# Patient Record
Sex: Female | Born: 1935 | Race: White | Hispanic: No
Health system: Southern US, Community
[De-identification: ages and names within clinical notes are randomized; demographics above are authoritative.]

## PROBLEM LIST (undated history)

## (undated) DIAGNOSIS — C7800 Secondary malignant neoplasm of unspecified lung: Secondary | ICD-10-CM

## (undated) DIAGNOSIS — J189 Pneumonia, unspecified organism: Secondary | ICD-10-CM

## (undated) DIAGNOSIS — C159 Malignant neoplasm of esophagus, unspecified: Secondary | ICD-10-CM

## (undated) DIAGNOSIS — J9621 Acute and chronic respiratory failure with hypoxia: Secondary | ICD-10-CM

## (undated) DIAGNOSIS — I2699 Other pulmonary embolism without acute cor pulmonale: Secondary | ICD-10-CM

---

## 2018-05-17 ENCOUNTER — Other Ambulatory Visit (HOSPITAL_COMMUNITY): Payer: Medicare Other

## 2018-05-17 ENCOUNTER — Inpatient Hospital Stay
Admission: AD | Admit: 2018-05-17 | Discharge: 2018-05-29 | Disposition: A | Discharge status: Skilled Nursing Facility | Payer: Medicare Other | Source: Other Acute Inpatient Hospital | Attending: Internal Medicine | Admitting: Internal Medicine

## 2018-05-17 DIAGNOSIS — J189 Pneumonia, unspecified organism: Secondary | ICD-10-CM | POA: Diagnosis present

## 2018-05-17 DIAGNOSIS — I2699 Other pulmonary embolism without acute cor pulmonale: Secondary | ICD-10-CM | POA: Diagnosis present

## 2018-05-17 DIAGNOSIS — J9621 Acute and chronic respiratory failure with hypoxia: Secondary | ICD-10-CM | POA: Diagnosis present

## 2018-05-17 DIAGNOSIS — C7800 Secondary malignant neoplasm of unspecified lung: Secondary | ICD-10-CM | POA: Diagnosis present

## 2018-05-17 DIAGNOSIS — Z431 Encounter for attention to gastrostomy: Secondary | ICD-10-CM

## 2018-05-17 DIAGNOSIS — C159 Malignant neoplasm of esophagus, unspecified: Secondary | ICD-10-CM | POA: Diagnosis present

## 2018-05-17 DIAGNOSIS — T17908A Unspecified foreign body in respiratory tract, part unspecified causing other injury, initial encounter: Secondary | ICD-10-CM

## 2018-05-17 DIAGNOSIS — K567 Ileus, unspecified: Secondary | ICD-10-CM

## 2018-05-17 HISTORY — DX: Other pulmonary embolism without acute cor pulmonale: I26.99

## 2018-05-17 HISTORY — DX: Acute and chronic respiratory failure with hypoxia: J96.21

## 2018-05-17 HISTORY — DX: Malignant neoplasm of esophagus, unspecified: C15.9

## 2018-05-17 HISTORY — DX: Pneumonia, unspecified organism: J18.9

## 2018-05-17 HISTORY — DX: Secondary malignant neoplasm of unspecified lung: C78.00

## 2018-05-17 LAB — C DIFFICILE QUICK SCREEN W PCR REFLEX
C Diff antigen: NEGATIVE
C Diff interpretation: NOT DETECTED
C Diff toxin: NEGATIVE

## 2018-05-17 MED ORDER — IOPAMIDOL (ISOVUE-300) INJECTION 61%
INTRAVENOUS | Status: AC
  Administered 2018-05-17: 50 mL via GASTROSTOMY
  Filled 2018-05-17: qty 50

## 2018-05-18 LAB — CBC WITH DIFFERENTIAL/PLATELET
Abs Immature Granulocytes: 0.13 10*3/uL — ABNORMAL HIGH (ref 0.00–0.07)
Basophils Absolute: 0 10*3/uL (ref 0.0–0.1)
Basophils Relative: 0 %
Eosinophils Absolute: 0.1 10*3/uL (ref 0.0–0.5)
Eosinophils Relative: 1 %
HEMATOCRIT: 27 % — AB (ref 36.0–46.0)
HEMOGLOBIN: 8.9 g/dL — AB (ref 12.0–15.0)
Immature Granulocytes: 2 %
Lymphocytes Relative: 3 %
Lymphs Abs: 0.2 10*3/uL — ABNORMAL LOW (ref 0.7–4.0)
MCH: 29.8 pg (ref 26.0–34.0)
MCHC: 33 g/dL (ref 30.0–36.0)
MCV: 90.3 fL (ref 80.0–100.0)
Monocytes Absolute: 0.6 10*3/uL (ref 0.1–1.0)
Monocytes Relative: 8 %
NRBC: 0 % (ref 0.0–0.2)
Neutro Abs: 6.3 10*3/uL (ref 1.7–7.7)
Neutrophils Relative %: 86 %
Platelets: 203 10*3/uL (ref 150–400)
RBC: 2.99 MIL/uL — ABNORMAL LOW (ref 3.87–5.11)
RDW: 15.1 % (ref 11.5–15.5)
WBC: 7.3 10*3/uL (ref 4.0–10.5)

## 2018-05-18 LAB — COMPREHENSIVE METABOLIC PANEL
ALBUMIN: 2 g/dL — AB (ref 3.5–5.0)
ALT: 34 U/L (ref 0–44)
AST: 24 U/L (ref 15–41)
Alkaline Phosphatase: 149 U/L — ABNORMAL HIGH (ref 38–126)
Anion gap: 9 (ref 5–15)
BUN: 13 mg/dL (ref 8–23)
CO2: 26 mmol/L (ref 22–32)
CREATININE: 0.57 mg/dL (ref 0.44–1.00)
Calcium: 8.1 mg/dL — ABNORMAL LOW (ref 8.9–10.3)
Chloride: 95 mmol/L — ABNORMAL LOW (ref 98–111)
GFR calc Af Amer: >60 mL/min (ref >60)
GFR calc non Af Amer: >60 mL/min (ref >60)
GLUCOSE: 143 mg/dL — AB (ref 70–99)
Potassium: 3.1 mmol/L — ABNORMAL LOW (ref 3.5–5.1)
Sodium: 130 mmol/L — ABNORMAL LOW (ref 135–145)
Total Bilirubin: 0.5 mg/dL (ref 0.3–1.2)
Total Protein: 4.9 g/dL — ABNORMAL LOW (ref 6.5–8.1)

## 2018-05-18 LAB — URINALYSIS, ROUTINE W REFLEX MICROSCOPIC
Bacteria, UA: NONE SEEN
Bilirubin Urine: NEGATIVE
Glucose, UA: NEGATIVE mg/dL
Ketones, ur: NEGATIVE mg/dL
LEUKOCYTES UA: NEGATIVE
Nitrite: NEGATIVE
Protein, ur: NEGATIVE mg/dL
Specific Gravity, Urine: 1.019 (ref 1.005–1.030)
pH: 6 (ref 5.0–8.0)

## 2018-05-18 LAB — PHOSPHORUS: Phosphorus: 3.1 mg/dL (ref 2.5–4.6)

## 2018-05-18 LAB — TSH: TSH: 38.548 u[IU]/mL — ABNORMAL HIGH (ref 0.350–4.500)

## 2018-05-18 LAB — PROTIME-INR
INR: 1.26
Prothrombin Time: 15.7 seconds — ABNORMAL HIGH (ref 11.4–15.2)

## 2018-05-18 LAB — HEMOGLOBIN A1C
Hgb A1c MFr Bld: 5.8 % — ABNORMAL HIGH (ref 4.8–5.6)
Mean Plasma Glucose: 119.76 mg/dL

## 2018-05-18 LAB — MAGNESIUM: Magnesium: 1.6 mg/dL — ABNORMAL LOW (ref 1.7–2.4)

## 2018-05-19 ENCOUNTER — Encounter: Payer: Self-pay | Admitting: Internal Medicine

## 2018-05-19 ENCOUNTER — Other Ambulatory Visit (HOSPITAL_COMMUNITY): Payer: Medicare Other

## 2018-05-19 DIAGNOSIS — C7800 Secondary malignant neoplasm of unspecified lung: Secondary | ICD-10-CM | POA: Diagnosis present

## 2018-05-19 DIAGNOSIS — C7802 Secondary malignant neoplasm of left lung: Secondary | ICD-10-CM | POA: Diagnosis not present

## 2018-05-19 DIAGNOSIS — I2699 Other pulmonary embolism without acute cor pulmonale: Secondary | ICD-10-CM | POA: Diagnosis present

## 2018-05-19 DIAGNOSIS — J9621 Acute and chronic respiratory failure with hypoxia: Secondary | ICD-10-CM | POA: Diagnosis present

## 2018-05-19 DIAGNOSIS — C159 Malignant neoplasm of esophagus, unspecified: Secondary | ICD-10-CM

## 2018-05-19 DIAGNOSIS — J189 Pneumonia, unspecified organism: Secondary | ICD-10-CM | POA: Diagnosis not present

## 2018-05-19 LAB — BASIC METABOLIC PANEL
Anion gap: 7 (ref 5–15)
BUN: 15 mg/dL (ref 8–23)
CO2: 30 mmol/L (ref 22–32)
Calcium: 8.1 mg/dL — ABNORMAL LOW (ref 8.9–10.3)
Chloride: 91 mmol/L — ABNORMAL LOW (ref 98–111)
Creatinine, Ser: 0.6 mg/dL (ref 0.44–1.00)
GFR calc Af Amer: >60 mL/min (ref >60)
Glucose, Bld: 167 mg/dL — ABNORMAL HIGH (ref 70–99)
Potassium: 3.9 mmol/L (ref 3.5–5.1)
Sodium: 128 mmol/L — ABNORMAL LOW (ref 135–145)

## 2018-05-19 LAB — PHOSPHORUS: Phosphorus: 3.5 mg/dL (ref 2.5–4.6)

## 2018-05-19 LAB — CBC
HCT: 29 % — ABNORMAL LOW (ref 36.0–46.0)
Hemoglobin: 9.5 g/dL — ABNORMAL LOW (ref 12.0–15.0)
MCH: 30 pg (ref 26.0–34.0)
MCHC: 32.8 g/dL (ref 30.0–36.0)
MCV: 91.5 fL (ref 80.0–100.0)
NRBC: 0 % (ref 0.0–0.2)
Platelets: 222 10*3/uL (ref 150–400)
RBC: 3.17 MIL/uL — ABNORMAL LOW (ref 3.87–5.11)
RDW: 15.1 % (ref 11.5–15.5)
WBC: 6.9 10*3/uL (ref 4.0–10.5)

## 2018-05-19 LAB — URINALYSIS, ROUTINE W REFLEX MICROSCOPIC
Bilirubin Urine: NEGATIVE
Glucose, UA: NEGATIVE mg/dL
Ketones, ur: NEGATIVE mg/dL
Nitrite: NEGATIVE
Protein, ur: NEGATIVE mg/dL
Specific Gravity, Urine: 1.015 (ref 1.005–1.030)
pH: 6 (ref 5.0–8.0)

## 2018-05-19 LAB — MAGNESIUM: Magnesium: 2.1 mg/dL (ref 1.7–2.4)

## 2018-05-19 LAB — T4, FREE: Free T4: 0.78 ng/dL — ABNORMAL LOW (ref 0.82–1.77)

## 2018-05-19 MED ORDER — ACETAMINOPHEN 650 MG/20.3ML PO SOLN
650.00 | ORAL | Status: DC
Start: ? — End: 2018-05-19

## 2018-05-19 MED ORDER — GENERIC EXTERNAL MEDICATION
5.00 | Status: DC
Start: ? — End: 2018-05-19

## 2018-05-19 MED ORDER — GENERIC EXTERNAL MEDICATION
12.00 | Status: DC
Start: ? — End: 2018-05-19

## 2018-05-19 MED ORDER — ONDANSETRON HCL 4 MG/2ML IJ SOLN
4.00 | INTRAMUSCULAR | Status: DC
Start: ? — End: 2018-05-19

## 2018-05-19 MED ORDER — GLUCAGON HCL (DIAGNOSTIC) 1 MG IJ SOLR
1.00 | INTRAMUSCULAR | Status: DC
Start: ? — End: 2018-05-19

## 2018-05-19 MED ORDER — GLUCOSE 40 % PO GEL
ORAL | Status: DC
Start: ? — End: 2018-05-19

## 2018-05-19 MED ORDER — MORPHINE SULFATE 2 MG/ML IJ SOLN
1.00 | INTRAMUSCULAR | Status: DC
Start: ? — End: 2018-05-19

## 2018-05-19 MED ORDER — INSULIN REGULAR HUMAN 100 UNIT/ML IJ SOLN
0.00 | INTRAMUSCULAR | Status: DC
Start: 2018-05-17 — End: 2018-05-19

## 2018-05-19 MED ORDER — CLONAZEPAM 0.5 MG PO TABS
0.50 | ORAL_TABLET | ORAL | Status: DC
Start: 2018-05-17 — End: 2018-05-19

## 2018-05-19 MED ORDER — APIXABAN 5 MG PO TABS
5.00 | ORAL_TABLET | ORAL | Status: DC
Start: 2018-05-17 — End: 2018-05-19

## 2018-05-19 MED ORDER — PANTOPRAZOLE 40 MG/20 ML SUSPENSION
40.00 | PACK | ORAL | Status: DC
Start: 2018-05-18 — End: 2018-05-19

## 2018-05-19 MED ORDER — LORAZEPAM 2 MG/ML IJ SOLN
0.50 | INTRAMUSCULAR | Status: DC
Start: ? — End: 2018-05-19

## 2018-05-19 MED ORDER — PROSOURCE NO CARB PO LIQD
30.00 | ORAL | Status: DC
Start: 2018-05-18 — End: 2018-05-19

## 2018-05-19 MED ORDER — DEXTROSE 50 % IV SOLN
50.00 | INTRAVENOUS | Status: DC
Start: ? — End: 2018-05-19

## 2018-05-19 MED ORDER — METRONIDAZOLE 0.75 % EX GEL
1.00 | CUTANEOUS | Status: DC
Start: ? — End: 2018-05-19

## 2018-05-19 MED ORDER — DILTIAZEM HCL 30 MG PO TABS
30.00 | ORAL_TABLET | ORAL | Status: DC
Start: 2018-05-17 — End: 2018-05-19

## 2018-05-19 MED ORDER — MELATONIN 3 MG PO TABS
6.00 | ORAL_TABLET | ORAL | Status: DC
Start: ? — End: 2018-05-19

## 2018-05-19 MED ORDER — POLYETHYLENE GLYCOL 3350 17 G PO PACK
17.00 | PACK | ORAL | Status: DC
Start: ? — End: 2018-05-19

## 2018-05-19 MED ORDER — LEVOTHYROXINE SODIUM 50 MCG PO TABS
50.00 | ORAL_TABLET | ORAL | Status: DC
Start: ? — End: 2018-05-19

## 2018-05-19 MED ORDER — HYDRALAZINE HCL 20 MG/ML IJ SOLN
10.00 | INTRAMUSCULAR | Status: DC
Start: ? — End: 2018-05-19

## 2018-05-19 MED ORDER — IPRATROPIUM-ALBUTEROL 0.5-2.5 (3) MG/3ML IN SOLN
3.00 | RESPIRATORY_TRACT | Status: DC
Start: 2018-05-17 — End: 2018-05-19

## 2018-05-19 NOTE — Consult Note (Signed)
Pulmonary [place]  Date of Service: 05/19/2018  PULMONARY CRITICAL CARE CONSULT   Carol White  OIZ:[id number]  DOB: 11/03/1933   DOA: 05/17/2018  Referring Physician: Merton Border, MD  HPI: Carol White is a 85 y.o. female seen for follow up of Acute on Chronic Respiratory Failure.  Patient was referred to [place] for further evaluation and management.  She has history of multiple malignancies including malignant neoplasm of the esophagus as well as of the lungs.  Patient was diagnosed approximately 19 months ago with a history of a cervical esophageal squamous cell carcinoma.  Patient is failing followed by oncology she underwent a feeding tube placement as well as radiation treatments.  Patient also was initiated with the chemotherapy several months prior to admission.  There was some improvement in the symptoms.  Issue however in August became that there was some mild pulmonary metastatic lesions noted in the left lower lobe.  She was treated with radiation therapy for this she presented back to the hospital with increasing shortness of breath.  Repeat CT scan of the neck showed mild thickening of the aryepiglottic folds which were similar to the prior studies and also had a patent subglottic airway.  Patient was noted to have stridor on expiration but was not in any distress.  This was all at the time of admission.  In addition patient was noted to have an acute pulmonary embolism and probable worsening of the neoplasm in the lungs.  She was started on anticoagulation for the pulmonary embolism with Eliquis.  Because of the ongoing stridor ENT was evaluated there was concern for aspiration pneumonia and mucous plugging in the left lower lobe.  This however later was felt to be due to an obstructive pneumonia from progression of metastatic disease.  Patient underwent intubation and radiation to the left lower lobe.  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  Tracheostomy at that time was discussed and tracheostomy was done.  She is now transferred to our facility for further management.  Review of Systems:  ROS performed and is unremarkable other than noted above.  Past Medical History:  Diagnosis Date  . Acne rosacea  . Cancer (CMS/HCC) ([place])  esophageal and mets to the lungs  . Difficulty swallowing 2018  . Hypertension   Past medical history has been independently reviewed by me.  Past Surgical History:   Past Surgical History:  Procedure Laterality Date  . APPENDECTOMY  . BLADDER REPAIR  . GASTROSTOMY TUBE PLACEMENT N/A 12/29/2016  Procedure: OPEN GASTROSTOMY TUBE PLACEMENT ; Surgeon: Jefm Miles, MD; Location: [place]; Service:  . HYSTERECTOMY   Past surgical history has been independently reviewed by me.  Allergies  Allergen Reactions  . Codeine GI intolerance  Causes Nausea   Allergies are independently reviewed by me.  Social History:   Social History   Tobacco Use  . Smoking status: Never Smoker  . Smokeless tobacco: Never Used  Substance Use Topics  . Alcohol use: No  . Drug use: No      Medications: Reviewed on Rounds  Physical Exam:  Vitals: Temperature is 98.2 pulse 106 respiratory 14 blood pressure 140/64 saturations 98%  Ventilator Settings currently is off the ventilator on T collar FiO2 is 28%  . General: Comfortable at this time . Eyes: Grossly normal lids, irises & conjunctiva .  ENT: grossly tongue is normal . Neck: no obvious mass . Cardiovascular: S1-S2 normal no gallop or rub . Respiratory: No rhonchi or rales are noted at this time . Abdomen: Soft and nontender . Skin: no rash seen on limited exam . Musculoskeletal: not rigid . Psychiatric:unable to assess . Neurologic: no seizure no involuntary movements         Labs on Admission:  Basic  Metabolic Panel: Recent Labs  Lab 05/18/18 0449 05/19/18 0523  NA 130* 128*  K 3.1* 3.9  CL 95* 91*  CO2 26 30  GLUCOSE 143* 167*  BUN 13 15  CREATININE 0.57 0.60  CALCIUM 8.1* 8.1*  MG 1.6* 2.1  PHOS 3.1 3.5    No results for input(s): PHART, PCO2ART, PO2ART, HCO3, O2SAT in the last 168 hours.  Liver Function Tests: Recent Labs  Lab 05/18/18 0449  AST 24  ALT 34  ALKPHOS 149*  BILITOT 0.5  PROT 4.9*  ALBUMIN 2.0*   No results for input(s): LIPASE, AMYLASE in the last 168 hours. No results for input(s): AMMONIA in the last 168 hours.  CBC: Recent Labs  Lab 05/18/18 0449 05/19/18 0523  WBC 7.3 6.9  NEUTROABS 6.3  --   HGB 8.9* 9.5*  HCT 27.0* 29.0*  MCV 90.3 91.5  PLT 203 222    Cardiac Enzymes: No results for input(s): CKTOTAL, CKMB, CKMBINDEX, TROPONINI in the last 168 hours.  BNP (last 3 results) No results for input(s): BNP in the last 8760 hours.  ProBNP (last 3 results) No results for input(s): PROBNP in the last 8760 hours.   Radiological Exams on Admission: Dg Chest Port 1 View  Result Date: 05/17/2018 CLINICAL DATA:  Tracheostomy tube.  Peg tube. EXAM: PORTABLE CHEST 1 VIEW COMPARISON:  None. FINDINGS: The heart is enlarged. Left basilar airspace disease is present. A left pleural effusion is noted. Tracheostomy tube is in place. A right subclavian line terminates at the cavoatrial junction. Right base is clear. Contrast is present within the stomach. Degenerative changes are noted at the shoulders, left greater than right. IMPRESSION: 1. Left lower lobe airspace disease and effusion, indicating pneumonia. 2. Cardiomegaly. 3. Support apparatus in satisfactory position. Electronically Signed   By: San Morelle M.D.   On: 05/17/2018 18:30   Dg Abd Portable 1v  Result Date: 05/17/2018 CLINICAL DATA:  Peg tube placement. EXAM: PORTABLE ABDOMEN - 1 VIEW COMPARISON:  One-view chest x-ray of the same day FINDINGS: Contrast injected via the PEG  tube layers in the stomach. Bowel gas pattern is otherwise normal. There is no evidence for leak. Degenerative changes are present in the lumbar spine. IMPRESSION: Peg tube confirmed within the stomach. Electronically Signed   By: San Morelle M.D.   On: 05/17/2018 18:31    Assessment/Plan Active Problems:   Acute on chronic respiratory failure with hypoxia (HCC)   Acute pulmonary embolism without acute cor pulmonale (HCC)   Postobstructive pneumonia   Squamous cell esophageal cancer (HCC)   Metastatic cancer to lung ([place])   1. Acute on chronic respiratory failure with hypoxia patient will continue with T collar at this time.  Because of her history of vocal cord paralysis it is unlikely that she will be decannulated.  She will need follow-up with ENT after discharge. 2. Pulmonary embolism treated with anticoagulation this will be continued.  We will continue to monitor closely. 3. Postobstructive pneumonia secondary to worsening of her neoplasm.  She is received radiation therapy will continue to monitor closely prognosis  obviously is quite poor. 4. Primary malignant neoplasm of the esophagus patient has had treatment with chemoradiation. 5. Metastatic lung cancer patient has been also treated with radiation which overall prognosis as already noted is poor  I have personally seen and evaluated the patient, evaluated laboratory and imaging results, formulated the assessment and plan and placed orders. The Patient requires high complexity decision making for assessment and support.  Case was discussed on Rounds with the Respiratory Therapy Staff Time Spent 58minutes  Allyne Gee, MD [facility] Pulmonary Critical Care Medicine Sleep Medicine

## 2018-05-20 DIAGNOSIS — I2699 Other pulmonary embolism without acute cor pulmonale: Secondary | ICD-10-CM | POA: Diagnosis not present

## 2018-05-20 DIAGNOSIS — J189 Pneumonia, unspecified organism: Secondary | ICD-10-CM | POA: Diagnosis not present

## 2018-05-20 DIAGNOSIS — C7802 Secondary malignant neoplasm of left lung: Secondary | ICD-10-CM | POA: Diagnosis not present

## 2018-05-20 DIAGNOSIS — J9621 Acute and chronic respiratory failure with hypoxia: Secondary | ICD-10-CM | POA: Diagnosis not present

## 2018-05-20 LAB — BASIC METABOLIC PANEL
Anion gap: 9 (ref 5–15)
BUN: 26 mg/dL — ABNORMAL HIGH (ref 8–23)
CO2: 27 mmol/L (ref 22–32)
Calcium: 8.2 mg/dL — ABNORMAL LOW (ref 8.9–10.3)
Chloride: 92 mmol/L — ABNORMAL LOW (ref 98–111)
Creatinine, Ser: 0.81 mg/dL (ref 0.44–1.00)
GFR calc non Af Amer: >60 mL/min (ref >60)
Glucose, Bld: 120 mg/dL — ABNORMAL HIGH (ref 70–99)
Potassium: 4.3 mmol/L (ref 3.5–5.1)
Sodium: 128 mmol/L — ABNORMAL LOW (ref 135–145)

## 2018-05-20 LAB — PHOSPHORUS: Phosphorus: 4.7 mg/dL — ABNORMAL HIGH (ref 2.5–4.6)

## 2018-05-20 LAB — CBC
HCT: 26.3 % — ABNORMAL LOW (ref 36.0–46.0)
Hemoglobin: 8.6 g/dL — ABNORMAL LOW (ref 12.0–15.0)
MCH: 29.7 pg (ref 26.0–34.0)
MCHC: 32.7 g/dL (ref 30.0–36.0)
MCV: 90.7 fL (ref 80.0–100.0)
Platelets: 247 10*3/uL (ref 150–400)
RBC: 2.9 MIL/uL — ABNORMAL LOW (ref 3.87–5.11)
RDW: 15.3 % (ref 11.5–15.5)
WBC: 7.6 10*3/uL (ref 4.0–10.5)
nRBC: 0 % (ref 0.0–0.2)

## 2018-05-20 LAB — MAGNESIUM: Magnesium: 2.1 mg/dL (ref 1.7–2.4)

## 2018-05-20 NOTE — Progress Notes (Signed)
Pulmonary Critical Care Medicine [place]   PULMONARY CRITICAL CARE SERVICE  PROGRESS NOTE  Date of Service: 05/20/2018  Carol White  WCH:[id number]  DOB: 1934-03-18   DOA: 05/17/2018  Referring Physician: Merton Border, MD  HPI: Carol White is a 85 y.o. female seen for follow up of Acute on Chronic Respiratory Failure.  At this time patient is on T collar she is comfortable without distress she is awake.  Family was present in the room.  Spoke with the daughter as well as son over the phone explained to them that she has diagnosis stage IIIb cancer overall her prognosis is quite guarded.  In addition explained to them that we will not be taking the tracheostomy out anytime soon because of the suspicion of paralyzed vocal cords and the difficulty with her prior intubation and extubation.  Medications: Reviewed on Rounds  Physical Exam:  Vitals: Temperature 97.0 pulse 103 respiratory rate 18 blood pressure 110/66 saturations 97%  Ventilator Settings off the ventilator on T collar FiO2 28%  . General: Comfortable at this time . Eyes: Grossly normal lids, irises & conjunctiva . ENT: grossly tongue is normal . Neck: no obvious mass . Cardiovascular: S1 S2 normal no gallop . Respiratory: Scattered rhonchi expansion is equal . Abdomen: soft . Skin: no rash seen on limited exam . Musculoskeletal: not rigid . Psychiatric:unable to assess . Neurologic: no seizure no involuntary movements         Lab Data:   Basic Metabolic Panel: Recent Labs  Lab 05/18/18 0449 05/19/18 0523 05/20/18 0604  NA 130* 128* 128*  K 3.1* 3.9 4.3  CL 95* 91* 92*  CO2 26 30 27   GLUCOSE 143* 167* 120*  BUN 13 15 26*  CREATININE 0.57 0.60 0.81  CALCIUM 8.1* 8.1* 8.2*  MG 1.6* 2.1 2.1  PHOS 3.1 3.5 4.7*    ABG: No results for input(s): PHART, PCO2ART, PO2ART, HCO3, O2SAT in the last 168 hours.  Liver Function Tests: Recent Labs  Lab 05/18/18 0449  AST 24  ALT  34  ALKPHOS 149*  BILITOT 0.5  PROT 4.9*  ALBUMIN 2.0*   No results for input(s): LIPASE, AMYLASE in the last 168 hours. No results for input(s): AMMONIA in the last 168 hours.  CBC: Recent Labs  Lab 05/18/18 0449 05/19/18 0523 05/20/18 0604  WBC 7.3 6.9 7.6  NEUTROABS 6.3  --   --   HGB 8.9* 9.5* 8.6*  HCT 27.0* 29.0* 26.3*  MCV 90.3 91.5 90.7  PLT 203 222 247    Cardiac Enzymes: No results for input(s): CKTOTAL, CKMB, CKMBINDEX, TROPONINI in the last 168 hours.  BNP (last 3 results) No results for input(s): BNP in the last 8760 hours.  ProBNP (last 3 results) No results for input(s): PROBNP in the last 8760 hours.  Radiological Exams: Dg Chest Port 1 View  Result Date: 05/19/2018 CLINICAL DATA:  Follow-up LEFT LOWER LOBE pneumonia possibly related to aspiration. EXAM: PORTABLE CHEST 1 VIEW COMPARISON:  05/17/2018. FINDINGS: Tracheostomy tube tip in satisfactory position below the thoracic inlet. RIGHT jugular central venous catheter tip projects at or near the cavoatrial junction, unchanged. Dense consolidation in the LEFT LOWER LOBE and likely associated LEFT pleural effusion, unchanged. No new pulmonary parenchymal abnormalities. Cardiac silhouette mildly to moderately enlarged. Pulmonary vascularity normal. Note is made of gaseous distention of the visualized colon in the upper abdomen IMPRESSION: 1. Support apparatus satisfactory. 2. Stable dense pneumonia in the LEFT LOWER LOBE and likely  associated LEFT pleural effusion. 3. cardiomegaly without pulmonary edema. 4. Gaseous distention of the visualized colon in the upper abdomen, query ileus. Electronically Signed   By: Evangeline Dakin M.D.   On: 05/19/2018 19:57    Assessment/Plan Active Problems:   Acute on chronic respiratory failure with hypoxia (HCC)   Acute pulmonary embolism without acute cor pulmonale (HCC)   Postobstructive pneumonia   Squamous cell esophageal cancer (HCC)   Metastatic cancer to lung  ([place])   1. Acute on chronic respiratory failure with hypoxia we will continue with T collar continue secretion management pulmonary toilet.  Right now patient is at baseline 2. Acute pulmonary embolism treated continue with present management 3. Postobstructive pneumonia follow radiologically. 4. Squamous cell esophageal cancer prognosis poor 5. Metastatic cancer to the lung prognosis poor 60 years stage IIIb based on the notes   I have personally seen and evaluated the patient, evaluated laboratory and imaging results, formulated the assessment and plan and placed orders. The Patient requires high complexity decision making for assessment and support.  Case was discussed on Rounds with the Respiratory Therapy Staff  Allyne Gee, MD [facility] Pulmonary Critical Care Medicine Sleep Medicine

## 2018-05-21 DIAGNOSIS — C7802 Secondary malignant neoplasm of left lung: Secondary | ICD-10-CM | POA: Diagnosis not present

## 2018-05-21 DIAGNOSIS — I2699 Other pulmonary embolism without acute cor pulmonale: Secondary | ICD-10-CM | POA: Diagnosis not present

## 2018-05-21 DIAGNOSIS — J189 Pneumonia, unspecified organism: Secondary | ICD-10-CM | POA: Diagnosis not present

## 2018-05-21 DIAGNOSIS — J9621 Acute and chronic respiratory failure with hypoxia: Secondary | ICD-10-CM | POA: Diagnosis not present

## 2018-05-21 NOTE — Progress Notes (Signed)
Pulmonary Critical Care Medicine [place]   PULMONARY CRITICAL CARE SERVICE  PROGRESS NOTE  Date of Service: 05/21/2018  Carol White  [id number]  DOB: 09-Nov-1933   DOA: 05/17/2018  Referring Physician: Merton Border, MD  HPI: Carol White is a 85 y.o. female seen for follow up of Acute on Chronic Respiratory Failure.  Patient is doing about the same she is on T collar currently is on 28% FiO2.  As noted yesterday I do not have plans for decannulate in her secondary to vocal cord issues  Medications: Reviewed on Rounds  Physical Exam:  Vitals: Temperature 99.2 pulse 107 respiratory rate 25 blood pressure 147/55 saturations 99%  Ventilator Settings off the ventilator on T collar  . General: Comfortable at this time . Eyes: Grossly normal lids, irises & conjunctiva . ENT: grossly tongue is normal . Neck: no obvious mass . Cardiovascular: S1 S2 normal no gallop . Respiratory: No rhonchi no rales . Abdomen: soft . Skin: no rash seen on limited exam . Musculoskeletal: not rigid . Psychiatric:unable to assess . Neurologic: no seizure no involuntary movements         Lab Data:   Basic Metabolic Panel: Recent Labs  Lab 05/18/18 0449 05/19/18 0523 05/20/18 0604  NA 130* 128* 128*  K 3.1* 3.9 4.3  CL 95* 91* 92*  CO2 26 30 27   GLUCOSE 143* 167* 120*  BUN 13 15 26*  CREATININE 0.57 0.60 0.81  CALCIUM 8.1* 8.1* 8.2*  MG 1.6* 2.1 2.1  PHOS 3.1 3.5 4.7*    ABG: No results for input(s): PHART, PCO2ART, PO2ART, HCO3, O2SAT in the last 168 hours.  Liver Function Tests: Recent Labs  Lab 05/18/18 0449  AST 24  ALT 34  ALKPHOS 149*  BILITOT 0.5  PROT 4.9*  ALBUMIN 2.0*   No results for input(s): LIPASE, AMYLASE in the last 168 hours. No results for input(s): AMMONIA in the last 168 hours.  CBC: Recent Labs  Lab 05/18/18 0449 05/19/18 0523 05/20/18 0604  WBC 7.3 6.9 7.6  NEUTROABS 6.3  --   --   HGB 8.9* 9.5* 8.6*  HCT 27.0*  29.0* 26.3*  MCV 90.3 91.5 90.7  PLT 203 222 247    Cardiac Enzymes: No results for input(s): CKTOTAL, CKMB, CKMBINDEX, TROPONINI in the last 168 hours.  BNP (last 3 results) No results for input(s): BNP in the last 8760 hours.  ProBNP (last 3 results) No results for input(s): PROBNP in the last 8760 hours.  Radiological Exams: Dg Chest Port 1 View  Result Date: 05/19/2018 CLINICAL DATA:  Follow-up LEFT LOWER LOBE pneumonia possibly related to aspiration. EXAM: PORTABLE CHEST 1 VIEW COMPARISON:  05/17/2018. FINDINGS: Tracheostomy tube tip in satisfactory position below the thoracic inlet. RIGHT jugular central venous catheter tip projects at or near the cavoatrial junction, unchanged. Dense consolidation in the LEFT LOWER LOBE and likely associated LEFT pleural effusion, unchanged. No new pulmonary parenchymal abnormalities. Cardiac silhouette mildly to moderately enlarged. Pulmonary vascularity normal. Note is made of gaseous distention of the visualized colon in the upper abdomen IMPRESSION: 1. Support apparatus satisfactory. 2. Stable dense pneumonia in the LEFT LOWER LOBE and likely associated LEFT pleural effusion. 3. cardiomegaly without pulmonary edema. 4. Gaseous distention of the visualized colon in the upper abdomen, query ileus. Electronically Signed   By: Evangeline Dakin M.D.   On: 05/19/2018 19:57    Assessment/Plan Active Problems:   Acute on chronic respiratory failure with hypoxia (HCC)  Acute pulmonary embolism without acute cor pulmonale (HCC)   Postobstructive pneumonia   Squamous cell esophageal cancer (HCC)   Metastatic cancer to lung ([place])   1. Acute on chronic respiratory failure with hypoxia we will continue with T collar as tolerated continue secretion management pulmonary toilet 2. Acute pulmonary embolism treated we will continue supportive care 3. Postobstructive pneumonia secondary to malignancy we will continue supportive care 4. Metastatic lung cancer  we will continue with supportive care patient's family wants to discuss further with their oncologist 5. Squamous cell carcinoma of the esophagus overall prognosis is poor   I have personally seen and evaluated the patient, evaluated laboratory and imaging results, formulated the assessment and plan and placed orders. The Patient requires high complexity decision making for assessment and support.  Case was discussed on Rounds with the Respiratory Therapy Staff  Allyne Gee, MD [facility] Pulmonary Critical Care Medicine Sleep Medicine

## 2018-05-22 ENCOUNTER — Other Ambulatory Visit (HOSPITAL_COMMUNITY): Payer: Medicare Other

## 2018-05-22 DIAGNOSIS — C7802 Secondary malignant neoplasm of left lung: Secondary | ICD-10-CM | POA: Diagnosis not present

## 2018-05-22 DIAGNOSIS — J9621 Acute and chronic respiratory failure with hypoxia: Secondary | ICD-10-CM | POA: Diagnosis not present

## 2018-05-22 DIAGNOSIS — J189 Pneumonia, unspecified organism: Secondary | ICD-10-CM | POA: Diagnosis not present

## 2018-05-22 DIAGNOSIS — I2699 Other pulmonary embolism without acute cor pulmonale: Secondary | ICD-10-CM | POA: Diagnosis not present

## 2018-05-22 LAB — URINE CULTURE: Culture: >=100000 — AB

## 2018-05-22 LAB — BASIC METABOLIC PANEL
Anion gap: 10 (ref 5–15)
BUN: 18 mg/dL (ref 8–23)
CO2: 26 mmol/L (ref 22–32)
Calcium: 8.2 mg/dL — ABNORMAL LOW (ref 8.9–10.3)
Chloride: 96 mmol/L — ABNORMAL LOW (ref 98–111)
Creatinine, Ser: 0.76 mg/dL (ref 0.44–1.00)
GFR calc Af Amer: >60 mL/min (ref >60)
GFR calc non Af Amer: >60 mL/min (ref >60)
Glucose, Bld: 117 mg/dL — ABNORMAL HIGH (ref 70–99)
Potassium: 4.1 mmol/L (ref 3.5–5.1)
Sodium: 132 mmol/L — ABNORMAL LOW (ref 135–145)

## 2018-05-22 NOTE — Progress Notes (Signed)
Pulmonary Critical Care Medicine [place]   PULMONARY CRITICAL CARE SERVICE  PROGRESS NOTE  Date of Service: 05/22/2018  Carol White  IWP:[id number]  DOB: Apr 19, 1934   DOA: 05/17/2018  Referring Physician: Merton Border, MD  HPI: Carol White is a 85 y.o. female seen for follow up of Acute on Chronic Respiratory Failure.  Patient is currently on T collar without distress.  Family is thinking of home training and to take her home  Medications: Reviewed on Rounds  Physical Exam:  Vitals: Temperature 98.1 pulse 111 respiratory 16 blood pressure 129/72 saturations 98%  Ventilator Settings currently off the ventilator on T collar  . General: Comfortable at this time . Eyes: Grossly normal lids, irises & conjunctiva . ENT: grossly tongue is normal . Neck: no obvious mass . Cardiovascular: S1 S2 normal no gallop . Respiratory: No rhonchi or rales are noted at this time . Abdomen: soft . Skin: no rash seen on limited exam . Musculoskeletal: not rigid . Psychiatric:unable to assess . Neurologic: no seizure no involuntary movements         Lab Data:   Basic Metabolic Panel: Recent Labs  Lab 05/18/18 0449 05/19/18 0523 05/20/18 0604 05/22/18 0610  NA 130* 128* 128* 132*  K 3.1* 3.9 4.3 4.1  CL 95* 91* 92* 96*  CO2 26 30 27 26   GLUCOSE 143* 167* 120* 117*  BUN 13 15 26* 18  CREATININE 0.57 0.60 0.81 0.76  CALCIUM 8.1* 8.1* 8.2* 8.2*  MG 1.6* 2.1 2.1  --   PHOS 3.1 3.5 4.7*  --     ABG: No results for input(s): PHART, PCO2ART, PO2ART, HCO3, O2SAT in the last 168 hours.  Liver Function Tests: Recent Labs  Lab 05/18/18 0449  AST 24  ALT 34  ALKPHOS 149*  BILITOT 0.5  PROT 4.9*  ALBUMIN 2.0*   No results for input(s): LIPASE, AMYLASE in the last 168 hours. No results for input(s): AMMONIA in the last 168 hours.  CBC: Recent Labs  Lab 05/18/18 0449 05/19/18 0523 05/20/18 0604  WBC 7.3 6.9 7.6  NEUTROABS 6.3  --   --   HGB  8.9* 9.5* 8.6*  HCT 27.0* 29.0* 26.3*  MCV 90.3 91.5 90.7  PLT 203 222 247    Cardiac Enzymes: No results for input(s): CKTOTAL, CKMB, CKMBINDEX, TROPONINI in the last 168 hours.  BNP (last 3 results) No results for input(s): BNP in the last 8760 hours.  ProBNP (last 3 results) No results for input(s): PROBNP in the last 8760 hours.  Radiological Exams: No results found.  Assessment/Plan Active Problems:   Acute on chronic respiratory failure with hypoxia (HCC)   Acute pulmonary embolism without acute cor pulmonale (HCC)   Postobstructive pneumonia   Squamous cell esophageal cancer (HCC)   Metastatic cancer to lung ([place])   1. Acute on chronic respiratory failure with hypoxia we will continue with T collar as tolerated continue secretion management pulmonary toilet. 2. Acute pulmonary embolism continue with supportive care 3. Postobstructive pneumonia prognosis poor continue with aggressive pulmonary toilet 4. Squamous cell carcinoma esophagus advanced disease 5. Metastatic cancer to the lungs as noted previously advanced disease continue with supportive care   I have personally seen and evaluated the patient, evaluated laboratory and imaging results, formulated the assessment and plan and placed orders. The Patient requires high complexity decision making for assessment and support.  Case was discussed on Rounds with the Respiratory Therapy Staff  Allyne Gee, MD [facility]  Pulmonary Critical Care Medicine Sleep Medicine

## 2018-05-23 DIAGNOSIS — I2699 Other pulmonary embolism without acute cor pulmonale: Secondary | ICD-10-CM | POA: Diagnosis not present

## 2018-05-23 DIAGNOSIS — J189 Pneumonia, unspecified organism: Secondary | ICD-10-CM | POA: Diagnosis not present

## 2018-05-23 DIAGNOSIS — C7802 Secondary malignant neoplasm of left lung: Secondary | ICD-10-CM | POA: Diagnosis not present

## 2018-05-23 DIAGNOSIS — J9621 Acute and chronic respiratory failure with hypoxia: Secondary | ICD-10-CM | POA: Diagnosis not present

## 2018-05-23 NOTE — Progress Notes (Signed)
Pulmonary Critical Care Medicine [place]   PULMONARY CRITICAL CARE SERVICE  PROGRESS NOTE  Date of Service: 05/23/2018  ONDRIA OSWALD  WUJ:[id number]  DOB: [birth year]   DOA: 05/17/2018  Referring Physician: Merton Border, MD  HPI: Carol White is a 85 y.o. female seen for follow up of Acute on Chronic Respiratory Failure.  Spoke to the family at the bedside they were wanting her to be transferred closer to home and then will take her to a facility they will continue to work with her as far as the tracheostomy is concerned.  I explained to them that it is possible that she might at some point be able to have the tracheostomy out however at this point she is not ready and they do understand that  Medications: Reviewed on Rounds  Physical Exam:  Vitals: Temperature 98.2 pulse 110 respiratory 24 blood pressure 113/62 saturations 96%  Ventilator Settings currently is on T collar FiO2 28%  . General: Comfortable at this time . Eyes: Grossly normal lids, irises & conjunctiva . ENT: grossly tongue is normal . Neck: no obvious mass . Cardiovascular: S1 S2 normal no gallop . Respiratory: No rhonchi no rales . Abdomen: soft . Skin: no rash seen on limited exam . Musculoskeletal: not rigid . Psychiatric:unable to assess . Neurologic: no seizure no involuntary movements         Lab Data:   Basic Metabolic Panel: Recent Labs  Lab 05/18/18 0449 05/19/18 0523 05/20/18 0604 05/22/18 0610  NA 130* 128* 128* 132*  K 3.1* 3.9 4.3 4.1  CL 95* 91* 92* 96*  CO2 26 30 27 26   GLUCOSE 143* 167* 120* 117*  BUN 13 15 26* 18  CREATININE 0.57 0.60 0.81 0.76  CALCIUM 8.1* 8.1* 8.2* 8.2*  MG 1.6* 2.1 2.1  --   PHOS 3.1 3.5 4.7*  --     ABG: No results for input(s): PHART, PCO2ART, PO2ART, HCO3, O2SAT in the last 168 hours.  Liver Function Tests: Recent Labs  Lab 05/18/18 0449  AST 24  ALT 34  ALKPHOS 149*  BILITOT 0.5  PROT 4.9*  ALBUMIN 2.0*   No  results for input(s): LIPASE, AMYLASE in the last 168 hours. No results for input(s): AMMONIA in the last 168 hours.  CBC: Recent Labs  Lab 05/18/18 0449 05/19/18 0523 05/20/18 0604  WBC 7.3 6.9 7.6  NEUTROABS 6.3  --   --   HGB 8.9* 9.5* 8.6*  HCT 27.0* 29.0* 26.3*  MCV 90.3 91.5 90.7  PLT 203 222 247    Cardiac Enzymes: No results for input(s): CKTOTAL, CKMB, CKMBINDEX, TROPONINI in the last 168 hours.  BNP (last 3 results) No results for input(s): BNP in the last 8760 hours.  ProBNP (last 3 results) No results for input(s): PROBNP in the last 8760 hours.  Radiological Exams: Dg Abd Portable 1v  Result Date: 05/22/2018 CLINICAL DATA:  Followup abdominal ileus. EXAM: PORTABLE ABDOMEN - 1 VIEW COMPARISON:  05/17/2018 FINDINGS: Distended bowel, small bowel and colon, appears similar to the prior exam allowing for differences in technique. There is a small amount of residual stomach contrast as well as contrast within the rectosigmoid. Surgical clips in the left mid abdomen.  These are stable. IMPRESSION: 1. Mild distention of small bowel and colon consistent with an adynamic ileus. Electronically Signed   By: Lajean Manes M.D.   On: 05/22/2018 14:00    Assessment/Plan Active Problems:   Acute on chronic respiratory failure with  hypoxia ([place])   Acute pulmonary embolism without acute cor pulmonale (HCC)   Postobstructive pneumonia   Squamous cell esophageal cancer ([place])   Metastatic cancer to lung ([place])   1. Acute on chronic respiratory failure with hypoxia we will continue with T collar trials.  Continue pulmonary toilet supportive care 2. Acute pulmonary embolism treated improved 3. Postobstructive pneumonia secondary to lung cancer 4. Squamous cell carcinoma of the esophagus advanced disease 5. Metastatic lung cancer continue with supportive care prognosis guarded   I have personally seen and evaluated the patient, evaluated laboratory and imaging results, formulated  the assessment and plan and placed orders. The Patient requires high complexity decision making for assessment and support.  Case was discussed on Rounds with the Respiratory Therapy Staff  Allyne Gee, MD [facility] Pulmonary Critical Care Medicine Sleep Medicine

## 2018-05-24 ENCOUNTER — Other Ambulatory Visit (HOSPITAL_COMMUNITY): Payer: Medicare Other

## 2018-05-24 DIAGNOSIS — J189 Pneumonia, unspecified organism: Secondary | ICD-10-CM | POA: Diagnosis not present

## 2018-05-24 DIAGNOSIS — J9621 Acute and chronic respiratory failure with hypoxia: Secondary | ICD-10-CM | POA: Diagnosis not present

## 2018-05-24 DIAGNOSIS — C7802 Secondary malignant neoplasm of left lung: Secondary | ICD-10-CM | POA: Diagnosis not present

## 2018-05-24 DIAGNOSIS — I2699 Other pulmonary embolism without acute cor pulmonale: Secondary | ICD-10-CM | POA: Diagnosis not present

## 2018-05-24 LAB — BASIC METABOLIC PANEL
Anion gap: 9 (ref 5–15)
BUN: 16 mg/dL (ref 8–23)
CO2: 24 mmol/L (ref 22–32)
CREATININE: 0.64 mg/dL (ref 0.44–1.00)
Calcium: 7.8 mg/dL — ABNORMAL LOW (ref 8.9–10.3)
Chloride: 97 mmol/L — ABNORMAL LOW (ref 98–111)
GFR calc Af Amer: >60 mL/min (ref >60)
GFR calc non Af Amer: >60 mL/min (ref >60)
Glucose, Bld: 107 mg/dL — ABNORMAL HIGH (ref 70–99)
Potassium: 3.1 mmol/L — ABNORMAL LOW (ref 3.5–5.1)
Sodium: 130 mmol/L — ABNORMAL LOW (ref 135–145)

## 2018-05-24 LAB — OSMOLALITY, URINE: Osmolality, Ur: 509 mOsm/kg (ref 300–900)

## 2018-05-24 LAB — OSMOLALITY: Osmolality: 267 mOsm/kg — ABNORMAL LOW (ref 275–295)

## 2018-05-24 LAB — SODIUM, URINE, RANDOM: Sodium, Ur: 25 mmol/L

## 2018-05-24 NOTE — Progress Notes (Signed)
Pulmonary Critical Care Medicine [place]   PULMONARY CRITICAL CARE SERVICE  PROGRESS NOTE  Date of Service: 05/24/2018  Carol White  [id number]:[id number]  DOB: 1933/06/08   DOA: 05/17/2018  Referring Physician: Merton Border, MD  HPI: Carol White is a 85 y.o. female seen for follow up of Acute on Chronic Respiratory Failure.  Patient is on T collar at this time is been on 28% oxygen good saturations are noted  Medications: Reviewed on Rounds  Physical Exam:  Vitals: Temperature 97.2 pulse 101 respiratory 27 blood pressure 130/76 saturations 98%  Ventilator Settings on T collar FiO2 28%  . General: Comfortable at this time . Eyes: Grossly normal lids, irises & conjunctiva . ENT: grossly tongue is normal . Neck: no obvious mass . Cardiovascular: S1 S2 normal no gallop . Respiratory: No rhonchi or rales are noted at this time . Abdomen: soft . Skin: no rash seen on limited exam . Musculoskeletal: not rigid . Psychiatric:unable to assess . Neurologic: no seizure no involuntary movements         Lab Data:   Basic Metabolic Panel: Recent Labs  Lab 05/18/18 0449 05/19/18 0523 05/20/18 0604 05/22/18 0610 05/24/18 0542  NA 130* 128* 128* 132* 130*  K 3.1* 3.9 4.3 4.1 3.1*  CL 95* 91* 92* 96* 97*  CO2 26 30 27 26 24   GLUCOSE 143* 167* 120* 117* 107*  BUN 13 15 26* 18 16  CREATININE 0.57 0.60 0.81 0.76 0.64  CALCIUM 8.1* 8.1* 8.2* 8.2* 7.8*  MG 1.6* 2.1 2.1  --   --   PHOS 3.1 3.5 4.7*  --   --     ABG: No results for input(s): PHART, PCO2ART, PO2ART, HCO3, O2SAT in the last 168 hours.  Liver Function Tests: Recent Labs  Lab 05/18/18 0449  AST 24  ALT 34  ALKPHOS 149*  BILITOT 0.5  PROT 4.9*  ALBUMIN 2.0*   No results for input(s): LIPASE, AMYLASE in the last 168 hours. No results for input(s): AMMONIA in the last 168 hours.  CBC: Recent Labs  Lab 05/18/18 0449 05/19/18 0523 05/20/18 0604  WBC 7.3 6.9 7.6  NEUTROABS 6.3   --   --   HGB 8.9* 9.5* 8.6*  HCT 27.0* 29.0* 26.3*  MCV 90.3 91.5 90.7  PLT 203 222 247    Cardiac Enzymes: No results for input(s): CKTOTAL, CKMB, CKMBINDEX, TROPONINI in the last 168 hours.  BNP (last 3 results) No results for input(s): BNP in the last 8760 hours.  ProBNP (last 3 results) No results for input(s): PROBNP in the last 8760 hours.  Radiological Exams: Dg Abd Portable 1v  Result Date: 05/24/2018 CLINICAL DATA:  Follow-up ileus EXAM: PORTABLE ABDOMEN - 1 VIEW COMPARISON:  05/22/2018 FINDINGS: Scattered large and small bowel gas is noted. Gastrostomy catheter is noted within the stomach. Dilated loop of bowel to 14 cm is noted in the mid abdomen likely related to a mobile cecum. This is stable from the prior exam. Endoscopic clips in the stomach are noted. No free air is noted. Degenerative changes of lumbar spine are seen. IMPRESSION: Persistent dilatation of the colon consistent with ileus. Electronically Signed   By: Inez Catalina M.D.   On: 05/24/2018 08:04   Dg Abd Portable 1v  Result Date: 05/22/2018 CLINICAL DATA:  Followup abdominal ileus. EXAM: PORTABLE ABDOMEN - 1 VIEW COMPARISON:  05/17/2018 FINDINGS: Distended bowel, small bowel and colon, appears similar to the prior exam allowing for differences in  technique. There is a small amount of residual stomach contrast as well as contrast within the rectosigmoid. Surgical clips in the left mid abdomen.  These are stable. IMPRESSION: 1. Mild distention of small bowel and colon consistent with an adynamic ileus. Electronically Signed   By: Lajean Manes M.D.   On: 05/22/2018 14:00    Assessment/Plan Active Problems:   Acute on chronic respiratory failure with hypoxia (HCC)   Acute pulmonary embolism without acute cor pulmonale (HCC)   Postobstructive pneumonia   Squamous cell esophageal cancer (HCC)   Metastatic cancer to lung ([place])   1. Acute on chronic respiratory failure with hypoxia patient is currently on T  collar she is at baseline.  She is not to be decannulated per plans for now 2. Acute pulmonary embolism treated we will continue with supportive care 3. Postobstructive pneumonia secondary to metastatic lung cancer 4. Squamous cell cancer of the esophagus advanced disease 5. Metastatic lung cancer as noted above patient has advanced disease we will continue with supportive care   I have personally seen and evaluated the patient, evaluated laboratory and imaging results, formulated the assessment and plan and placed orders. The Patient requires high complexity decision making for assessment and support.  Case was discussed on Rounds with the Respiratory Therapy Staff  Allyne Gee, MD [facility] Pulmonary Critical Care Medicine Sleep Medicine

## 2018-05-25 DIAGNOSIS — I2699 Other pulmonary embolism without acute cor pulmonale: Secondary | ICD-10-CM | POA: Diagnosis not present

## 2018-05-25 DIAGNOSIS — C7802 Secondary malignant neoplasm of left lung: Secondary | ICD-10-CM | POA: Diagnosis not present

## 2018-05-25 DIAGNOSIS — J189 Pneumonia, unspecified organism: Secondary | ICD-10-CM | POA: Diagnosis not present

## 2018-05-25 DIAGNOSIS — J9621 Acute and chronic respiratory failure with hypoxia: Secondary | ICD-10-CM | POA: Diagnosis not present

## 2018-05-25 LAB — BASIC METABOLIC PANEL
Anion gap: 10 (ref 5–15)
BUN: 15 mg/dL (ref 8–23)
CO2: 26 mmol/L (ref 22–32)
Calcium: 7.9 mg/dL — ABNORMAL LOW (ref 8.9–10.3)
Chloride: 94 mmol/L — ABNORMAL LOW (ref 98–111)
Creatinine, Ser: 0.67 mg/dL (ref 0.44–1.00)
GFR calc Af Amer: >60 mL/min (ref >60)
GFR calc non Af Amer: >60 mL/min (ref >60)
GLUCOSE: 96 mg/dL (ref 70–99)
Potassium: 3.7 mmol/L (ref 3.5–5.1)
Sodium: 130 mmol/L — ABNORMAL LOW (ref 135–145)

## 2018-05-25 NOTE — Progress Notes (Signed)
Pulmonary Critical Care Medicine [place]   PULMONARY CRITICAL CARE SERVICE  PROGRESS NOTE  Date of Service: 05/25/2018  Carol White  VQM:[id number]  DOB: 1933/08/11   DOA: 05/17/2018  Referring Physician: Merton Border, MD  HPI: Carol White is a 85 y.o. female seen for follow up of Acute on Chronic Respiratory Failure.  Patient is on T collar she is at baseline.  Respiratory therapy reports some blood noted from her trach which is now resolved  Medications: Reviewed on Rounds  Physical Exam:  Vitals: Temperature 97.9 pulse 88 respiratory 16 blood pressure 124/64 saturations 98%  Ventilator Settings currently on T collar FiO2 is 28%  . General: Comfortable at this time . Eyes: Grossly normal lids, irises & conjunctiva . ENT: grossly tongue is normal . Neck: no obvious mass . Cardiovascular: S1 S2 normal no gallop . Respiratory: No rhonchi or rales are noted at this time . Abdomen: soft . Skin: no rash seen on limited exam . Musculoskeletal: not rigid . Psychiatric:unable to assess . Neurologic: no seizure no involuntary movements         Lab Data:   Basic Metabolic Panel: Recent Labs  Lab 05/19/18 0523 05/20/18 0604 05/22/18 0610 05/24/18 0542 05/25/18 0710  NA 128* 128* 132* 130* 130*  K 3.9 4.3 4.1 3.1* 3.7  CL 91* 92* 96* 97* 94*  CO2 30 27 26 24 26   GLUCOSE 167* 120* 117* 107* 96  BUN 15 26* 18 16 15   CREATININE 0.60 0.81 0.76 0.64 0.67  CALCIUM 8.1* 8.2* 8.2* 7.8* 7.9*  MG 2.1 2.1  --   --   --   PHOS 3.5 4.7*  --   --   --     ABG: No results for input(s): PHART, PCO2ART, PO2ART, HCO3, O2SAT in the last 168 hours.  Liver Function Tests: No results for input(s): AST, ALT, ALKPHOS, BILITOT, PROT, ALBUMIN in the last 168 hours. No results for input(s): LIPASE, AMYLASE in the last 168 hours. No results for input(s): AMMONIA in the last 168 hours.  CBC: Recent Labs  Lab 05/19/18 0523 05/20/18 0604  WBC 6.9 7.6  HGB  9.5* 8.6*  HCT 29.0* 26.3*  MCV 91.5 90.7  PLT 222 247    Cardiac Enzymes: No results for input(s): CKTOTAL, CKMB, CKMBINDEX, TROPONINI in the last 168 hours.  BNP (last 3 results) No results for input(s): BNP in the last 8760 hours.  ProBNP (last 3 results) No results for input(s): PROBNP in the last 8760 hours.  Radiological Exams: Dg Abd Portable 1v  Result Date: 05/24/2018 CLINICAL DATA:  Follow-up ileus EXAM: PORTABLE ABDOMEN - 1 VIEW COMPARISON:  05/22/2018 FINDINGS: Scattered large and small bowel gas is noted. Gastrostomy catheter is noted within the stomach. Dilated loop of bowel to 14 cm is noted in the mid abdomen likely related to a mobile cecum. This is stable from the prior exam. Endoscopic clips in the stomach are noted. No free air is noted. Degenerative changes of lumbar spine are seen. IMPRESSION: Persistent dilatation of the colon consistent with ileus. Electronically Signed   By: Inez Catalina M.D.   On: 05/24/2018 08:04    Assessment/Plan Active Problems:   Acute on chronic respiratory failure with hypoxia (HCC)   Acute pulmonary embolism without acute cor pulmonale (HCC)   Postobstructive pneumonia   Squamous cell esophageal cancer (HCC)   Metastatic cancer to lung ([place])   1. Acute on chronic respiratory failure with hypoxia we will continue  with T collar trials continue secretion management monitor for any more blood 2. Acute pulmonary embolism treated we will continue with supportive care 3. Postobstructive pneumonia secondary to lung cancer supportive care follow x-ray as necessary 4. Squamous cell cancer esophagus follow-up with oncology 5. Metastatic cancer to the lung prognosis poor follow-up with oncology   I have personally seen and evaluated the patient, evaluated laboratory and imaging results, formulated the assessment and plan and placed orders. The Patient requires high complexity decision making for assessment and support.  Case was discussed  on Rounds with the Respiratory Therapy Staff  Allyne Gee, MD [facility] Pulmonary Critical Care Medicine Sleep Medicine

## 2018-05-26 DIAGNOSIS — J189 Pneumonia, unspecified organism: Secondary | ICD-10-CM | POA: Diagnosis not present

## 2018-05-26 DIAGNOSIS — I2699 Other pulmonary embolism without acute cor pulmonale: Secondary | ICD-10-CM | POA: Diagnosis not present

## 2018-05-26 DIAGNOSIS — J9621 Acute and chronic respiratory failure with hypoxia: Secondary | ICD-10-CM | POA: Diagnosis not present

## 2018-05-26 DIAGNOSIS — C7802 Secondary malignant neoplasm of left lung: Secondary | ICD-10-CM | POA: Diagnosis not present

## 2018-05-26 NOTE — Progress Notes (Signed)
Pulmonary Critical Care Medicine [place]   PULMONARY CRITICAL CARE SERVICE  PROGRESS NOTE  Date of Service: 05/26/2018  Carol White  SJG:[id number]  DOB: [birth year]   DOA: 05/17/2018  Referring Physician: Merton Border, MD  HPI: Carol White is a 85 y.o. female seen for follow up of Acute on Chronic Respiratory Failure.  She is resting comfortably without distress has been on T collar no major changes are noted  Medications: Reviewed on Rounds  Physical Exam:  Vitals: Temperature 97.8 pulse 85 respiratory rate 12 blood pressure 122/63 saturations 97%  Ventilator Settings: T collar FiO2 28%  . General: Comfortable at this time . Eyes: Grossly normal lids, irises & conjunctiva . ENT: grossly tongue is normal . Neck: no obvious mass . Cardiovascular: S1 S2 normal no gallop . Respiratory: No rhonchi or rales are noted . Abdomen: soft . Skin: no rash seen on limited exam . Musculoskeletal: not rigid . Psychiatric:unable to assess . Neurologic: no seizure no involuntary movements         Lab Data:   Basic Metabolic Panel: Recent Labs  Lab 05/20/18 0604 05/22/18 0610 05/24/18 0542 05/25/18 0710  NA 128* 132* 130* 130*  K 4.3 4.1 3.1* 3.7  CL 92* 96* 97* 94*  CO2 27 26 24 26   GLUCOSE 120* 117* 107* 96  BUN 26* 18 16 15   CREATININE 0.81 0.76 0.64 0.67  CALCIUM 8.2* 8.2* 7.8* 7.9*  MG 2.1  --   --   --   PHOS 4.7*  --   --   --     ABG: No results for input(s): PHART, PCO2ART, PO2ART, HCO3, O2SAT in the last 168 hours.  Liver Function Tests: No results for input(s): AST, ALT, ALKPHOS, BILITOT, PROT, ALBUMIN in the last 168 hours. No results for input(s): LIPASE, AMYLASE in the last 168 hours. No results for input(s): AMMONIA in the last 168 hours.  CBC: Recent Labs  Lab 05/20/18 0604  WBC 7.6  HGB 8.6*  HCT 26.3*  MCV 90.7  PLT 247    Cardiac Enzymes: No results for input(s): CKTOTAL, CKMB, CKMBINDEX, TROPONINI in the  last 168 hours.  BNP (last 3 results) No results for input(s): BNP in the last 8760 hours.  ProBNP (last 3 results) No results for input(s): PROBNP in the last 8760 hours.  Radiological Exams: No results found.  Assessment/Plan Active Problems:   Acute on chronic respiratory failure with hypoxia (HCC)   Acute pulmonary embolism without acute cor pulmonale (HCC)   Postobstructive pneumonia   Squamous cell esophageal cancer (HCC)   Metastatic cancer to lung ([place])   1. Acute on chronic respiratory failure with hypoxia we will continue with T collar trials and supportive care 2. Pulmonary embolism treated we will continue present management 3. Postobstructive pneumonia secondary to lung cancer 4. Squamous cell carcinoma esophagus prognosis guarded 5. Metastatic lung cancer we will continue with supportive care   I have personally seen and evaluated the patient, evaluated laboratory and imaging results, formulated the assessment and plan and placed orders. The Patient requires high complexity decision making for assessment and support.  Case was discussed on Rounds with the Respiratory Therapy Staff  Allyne Gee, MD [facility] Pulmonary Critical Care Medicine Sleep Medicine

## 2018-05-27 ENCOUNTER — Other Ambulatory Visit (HOSPITAL_COMMUNITY): Payer: Medicare Other

## 2018-05-27 DIAGNOSIS — J189 Pneumonia, unspecified organism: Secondary | ICD-10-CM | POA: Diagnosis not present

## 2018-05-27 DIAGNOSIS — J9621 Acute and chronic respiratory failure with hypoxia: Secondary | ICD-10-CM | POA: Diagnosis not present

## 2018-05-27 DIAGNOSIS — I2699 Other pulmonary embolism without acute cor pulmonale: Secondary | ICD-10-CM | POA: Diagnosis not present

## 2018-05-27 DIAGNOSIS — C7802 Secondary malignant neoplasm of left lung: Secondary | ICD-10-CM | POA: Diagnosis not present

## 2018-05-27 LAB — MAGNESIUM: Magnesium: 1.7 mg/dL (ref 1.7–2.4)

## 2018-05-27 LAB — CBC
HEMATOCRIT: 25.6 % — AB (ref 36.0–46.0)
HEMOGLOBIN: 8.5 g/dL — AB (ref 12.0–15.0)
MCH: 30.4 pg (ref 26.0–34.0)
MCHC: 33.2 g/dL (ref 30.0–36.0)
MCV: 91.4 fL (ref 80.0–100.0)
Platelets: 333 10*3/uL (ref 150–400)
RBC: 2.8 MIL/uL — AB (ref 3.87–5.11)
RDW: 15.6 % — ABNORMAL HIGH (ref 11.5–15.5)
WBC: 5.2 10*3/uL (ref 4.0–10.5)
nRBC: 0 % (ref 0.0–0.2)

## 2018-05-27 LAB — BASIC METABOLIC PANEL
Anion gap: 9 (ref 5–15)
BUN: 15 mg/dL (ref 8–23)
CO2: 26 mmol/L (ref 22–32)
Calcium: 7.6 mg/dL — ABNORMAL LOW (ref 8.9–10.3)
Chloride: 96 mmol/L — ABNORMAL LOW (ref 98–111)
Creatinine, Ser: 0.77 mg/dL (ref 0.44–1.00)
GFR calc Af Amer: >60 mL/min (ref >60)
GFR calc non Af Amer: >60 mL/min (ref >60)
Glucose, Bld: 133 mg/dL — ABNORMAL HIGH (ref 70–99)
POTASSIUM: 2.7 mmol/L — AB (ref 3.5–5.1)
Sodium: 131 mmol/L — ABNORMAL LOW (ref 135–145)

## 2018-05-27 NOTE — Progress Notes (Signed)
Pulmonary Critical Care Medicine [place]   PULMONARY CRITICAL CARE SERVICE  PROGRESS NOTE  Date of Service: 05/27/2018  Carol White  OQH:[id number]  DOB: May 19, 1933   DOA: 05/17/2018  Referring Physician: Merton Border, MD  HPI: Carol White is a 85 y.o. female seen for follow up of Acute on Chronic Respiratory Failure.  Patient is doing about the same she remains with the trach in place.  She is on 28% FiO2 on T collar.  Spoke to the family present at the bedside.  Updated them.  They were wanting to know about her overall prognosis and also made clear to her family that from the last notes that I could tell she had stage IIIb tumor and her prognosis is poor and in fact they were talking about doing palliative care  Medications: Reviewed on Rounds  Physical Exam:  Vitals: Temperature 96.1 pulse 90 respiratory rate 17 blood pressure 121/61 saturations 96%  Ventilator Settings off the ventilator on T collar right now  . General: Comfortable at this time . Eyes: Grossly normal lids, irises & conjunctiva . ENT: grossly tongue is normal . Neck: no obvious mass . Cardiovascular: S1 S2 normal no gallop . Respiratory: No rhonchi or rales are noted . Abdomen: soft . Skin: no rash seen on limited exam . Musculoskeletal: not rigid . Psychiatric:unable to assess . Neurologic: no seizure no involuntary movements         Lab Data:   Basic Metabolic Panel: Recent Labs  Lab 05/22/18 0610 05/24/18 0542 05/25/18 0710 05/27/18 0638  NA 132* 130* 130* 131*  K 4.1 3.1* 3.7 2.7*  CL 96* 97* 94* 96*  CO2 26 24 26 26   GLUCOSE 117* 107* 96 133*  BUN 18 16 15 15   CREATININE 0.76 0.64 0.67 0.77  CALCIUM 8.2* 7.8* 7.9* 7.6*  MG  --   --   --  1.7    ABG: No results for input(s): PHART, PCO2ART, PO2ART, HCO3, O2SAT in the last 168 hours.  Liver Function Tests: No results for input(s): AST, ALT, ALKPHOS, BILITOT, PROT, ALBUMIN in the last 168 hours. No  results for input(s): LIPASE, AMYLASE in the last 168 hours. No results for input(s): AMMONIA in the last 168 hours.  CBC: Recent Labs  Lab 05/27/18 0638  WBC 5.2  HGB 8.5*  HCT 25.6*  MCV 91.4  PLT 333    Cardiac Enzymes: No results for input(s): CKTOTAL, CKMB, CKMBINDEX, TROPONINI in the last 168 hours.  BNP (last 3 results) No results for input(s): BNP in the last 8760 hours.  ProBNP (last 3 results) No results for input(s): PROBNP in the last 8760 hours.  Radiological Exams: Dg Abd Portable 1v  Result Date: 05/27/2018 CLINICAL DATA:  Ileus. EXAM: PORTABLE ABDOMEN - 1 VIEW COMPARISON:  05/24/2018 FINDINGS: Gaseous distention predominantly of the colon but also some small bowel remains and appears mildly improved. Gastrostomy tube remains as well as previously placed endoscopic hemostatic clips near the gastrostomy tube bumper. IMPRESSION: Mildly improved ileus of predominantly colon. Electronically Signed   By: Aletta Edouard M.D.   On: 05/27/2018 07:44    Assessment/Plan Active Problems:   Acute on chronic respiratory failure with hypoxia (HCC)   Acute pulmonary embolism without acute cor pulmonale (HCC)   Postobstructive pneumonia   Squamous cell esophageal cancer (HCC)   Metastatic cancer to lung ([place])   1. Acute on chronic respiratory failure with hypoxia we will continue with the T collar.  She  is a candidate for hospice palliative care care management is going to talk to the family in detail about this 2. Pulmonary embolism without cor pulmonale patient has been treated we will continue with supportive care 3. Postobstructive pneumonia secondary to metastatic disease 4. Squamous cell carcinoma of the esophagus metastatic to the lung 5. Metastatic cancer to the lung from the esophagus we will continue with supportive care   I have personally seen and evaluated the patient, evaluated laboratory and imaging results, formulated the assessment and plan and placed  orders. The Patient requires high complexity decision making for assessment and support.  Case was discussed on Rounds with the Respiratory Therapy Staff  Allyne Gee, MD [facility] Pulmonary Critical Care Medicine Sleep Medicine

## 2018-05-28 DIAGNOSIS — I2699 Other pulmonary embolism without acute cor pulmonale: Secondary | ICD-10-CM | POA: Diagnosis not present

## 2018-05-28 DIAGNOSIS — J189 Pneumonia, unspecified organism: Secondary | ICD-10-CM | POA: Diagnosis not present

## 2018-05-28 DIAGNOSIS — C7802 Secondary malignant neoplasm of left lung: Secondary | ICD-10-CM | POA: Diagnosis not present

## 2018-05-28 DIAGNOSIS — J9621 Acute and chronic respiratory failure with hypoxia: Secondary | ICD-10-CM | POA: Diagnosis not present

## 2018-05-28 LAB — BASIC METABOLIC PANEL
ANION GAP: 9 (ref 5–15)
BUN: 16 mg/dL (ref 8–23)
CO2: 26 mmol/L (ref 22–32)
Calcium: 8.2 mg/dL — ABNORMAL LOW (ref 8.9–10.3)
Chloride: 98 mmol/L (ref 98–111)
Creatinine, Ser: 0.71 mg/dL (ref 0.44–1.00)
GFR calc Af Amer: >60 mL/min (ref >60)
GFR calc non Af Amer: >60 mL/min (ref >60)
Glucose, Bld: 132 mg/dL — ABNORMAL HIGH (ref 70–99)
Potassium: 3.7 mmol/L (ref 3.5–5.1)
SODIUM: 133 mmol/L — AB (ref 135–145)

## 2018-05-28 LAB — PHOSPHORUS: Phosphorus: 3 mg/dL (ref 2.5–4.6)

## 2018-05-28 LAB — MAGNESIUM: MAGNESIUM: 2.2 mg/dL (ref 1.7–2.4)

## 2018-05-28 NOTE — Progress Notes (Signed)
Pulmonary Critical Care Medicine [place]   PULMONARY CRITICAL CARE SERVICE  PROGRESS NOTE  Date of Service: 05/28/2018  Carol White  SNK:[id number]  DOB: February 13, 1934   DOA: 05/17/2018  Referring Physician: Merton Border, MD  HPI: Carol White is a 85 y.o. female seen for follow up of Acute on Chronic Respiratory Failure.  At this time patient is on T collar she is comfortable without distress family was present at the bedside she should be able to be discharged possibly next week  Medications: Reviewed on Rounds  Physical Exam:  Vitals: Temperature 97.8 pulse 93 respiratory rate 13 blood pressure 107/69 saturations 98%  Ventilator Settings currently off the ventilator on T collar  . General: Comfortable at this time . Eyes: Grossly normal lids, irises & conjunctiva . ENT: grossly tongue is normal . Neck: no obvious mass . Cardiovascular: S1 S2 normal no gallop . Respiratory: No rhonchi or rales are noted . Abdomen: soft . Skin: no rash seen on limited exam . Musculoskeletal: not rigid . Psychiatric:unable to assess . Neurologic: no seizure no involuntary movements         Lab Data:   Basic Metabolic Panel: Recent Labs  Lab 05/22/18 0610 05/24/18 0542 05/25/18 0710 05/27/18 9379 05/28/18 0638  NA 132* 130* 130* 131* 133*  K 4.1 3.1* 3.7 2.7* 3.7  CL 96* 97* 94* 96* 98  CO2 26 24 26 26 26   GLUCOSE 117* 107* 96 133* 132*  BUN 18 16 15 15 16   CREATININE 0.76 0.64 0.67 0.77 0.71  CALCIUM 8.2* 7.8* 7.9* 7.6* 8.2*  MG  --   --   --  1.7 2.2  PHOS  --   --   --   --  3.0    ABG: No results for input(s): PHART, PCO2ART, PO2ART, HCO3, O2SAT in the last 168 hours.  Liver Function Tests: No results for input(s): AST, ALT, ALKPHOS, BILITOT, PROT, ALBUMIN in the last 168 hours. No results for input(s): LIPASE, AMYLASE in the last 168 hours. No results for input(s): AMMONIA in the last 168 hours.  CBC: Recent Labs  Lab 05/27/18 0638   WBC 5.2  HGB 8.5*  HCT 25.6*  MCV 91.4  PLT 333    Cardiac Enzymes: No results for input(s): CKTOTAL, CKMB, CKMBINDEX, TROPONINI in the last 168 hours.  BNP (last 3 results) No results for input(s): BNP in the last 8760 hours.  ProBNP (last 3 results) No results for input(s): PROBNP in the last 8760 hours.  Radiological Exams: Dg Abd Portable 1v  Result Date: 05/27/2018 CLINICAL DATA:  Ileus. EXAM: PORTABLE ABDOMEN - 1 VIEW COMPARISON:  05/24/2018 FINDINGS: Gaseous distention predominantly of the colon but also some small bowel remains and appears mildly improved. Gastrostomy tube remains as well as previously placed endoscopic hemostatic clips near the gastrostomy tube bumper. IMPRESSION: Mildly improved ileus of predominantly colon. Electronically Signed   By: Aletta Edouard M.D.   On: 05/27/2018 07:44    Assessment/Plan Active Problems:   Acute on chronic respiratory failure with hypoxia (HCC)   Acute pulmonary embolism without acute cor pulmonale (HCC)   Postobstructive pneumonia   Squamous cell esophageal cancer (HCC)   Metastatic cancer to lung ([place])   1. Acute on chronic respiratory failure with hypoxia we will continue with T collar wean as tolerated continue secretion management pulmonary toilet. 2. Acute pulmonary embolism with acute cor pulmonale treated continue with supportive care at this time. 3. Postobstructive pneumonia secondary to  lung cancer metastatic we will continue supportive care 4. Squamous cell carcinoma esophagus prognosis poor 5. Metastatic cancer to the lung we will continue with supportive care as mentioned   I have personally seen and evaluated the patient, evaluated laboratory and imaging results, formulated the assessment and plan and placed orders. The Patient requires high complexity decision making for assessment and support.  Case was discussed on Rounds with the Respiratory Therapy Staff  Allyne Gee, MD [facility] Pulmonary Critical  Care Medicine Sleep Medicine

## 2018-05-29 LAB — BASIC METABOLIC PANEL
Anion gap: 8 (ref 5–15)
BUN: 18 mg/dL (ref 8–23)
CO2: 28 mmol/L (ref 22–32)
Calcium: 7.8 mg/dL — ABNORMAL LOW (ref 8.9–10.3)
Chloride: 97 mmol/L — ABNORMAL LOW (ref 98–111)
Creatinine, Ser: 0.65 mg/dL (ref 0.44–1.00)
GFR calc non Af Amer: >60 mL/min (ref >60)
Glucose, Bld: 119 mg/dL — ABNORMAL HIGH (ref 70–99)
POTASSIUM: 4.3 mmol/L (ref 3.5–5.1)
Sodium: 133 mmol/L — ABNORMAL LOW (ref 135–145)

## 2018-05-29 LAB — CBC
HCT: 25.8 % — ABNORMAL LOW (ref 36.0–46.0)
HEMOGLOBIN: 8 g/dL — AB (ref 12.0–15.0)
MCH: 28.8 pg (ref 26.0–34.0)
MCHC: 31 g/dL (ref 30.0–36.0)
MCV: 92.8 fL (ref 80.0–100.0)
Platelets: 273 10*3/uL (ref 150–400)
RBC: 2.78 MIL/uL — AB (ref 3.87–5.11)
RDW: 15.9 % — ABNORMAL HIGH (ref 11.5–15.5)
WBC: 9.7 10*3/uL (ref 4.0–10.5)
nRBC: 0 % (ref 0.0–0.2)

## 2018-05-29 LAB — PHOSPHORUS: PHOSPHORUS: 3.4 mg/dL (ref 2.5–4.6)

## 2018-05-29 LAB — MAGNESIUM: Magnesium: 2 mg/dL (ref 1.7–2.4)

## 2018-11-06 DEATH — deceased

## 2019-05-27 IMAGING — DX DG ABD PORTABLE 1V
1 series · 1 of 1 positions shown · non-contrast
Comparison: 05/24/2018

CLINICAL DATA: Ileus.

EXAM:
PORTABLE ABDOMEN - 1 VIEW

[abdomen]
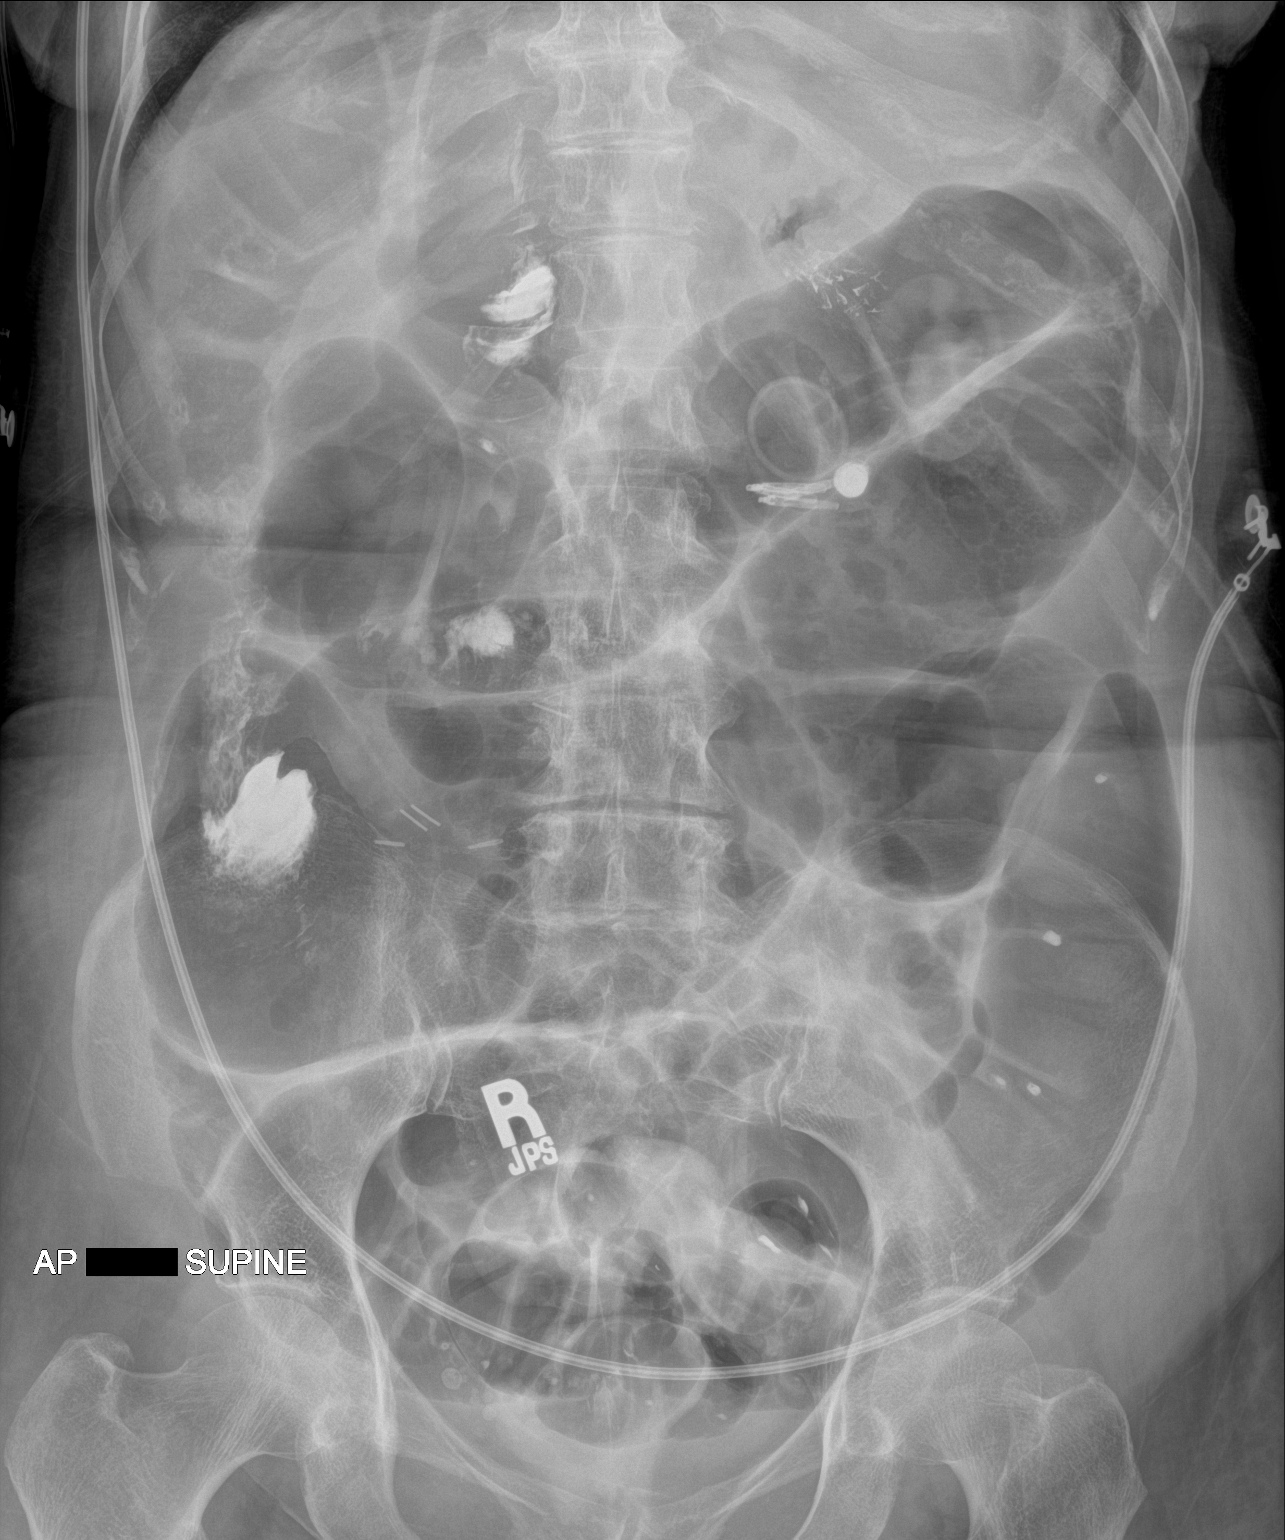

[1 of 1 positions shown; findings below may reference images not displayed]

FINDINGS: Gaseous distention predominantly of the colon but also some small
bowel remains and appears mildly improved. Gastrostomy tube remains
as well as previously placed endoscopic hemostatic clips near the
gastrostomy tube bumper.
IMPRESSION: Mildly improved ileus of predominantly colon.
